# Patient Record
Sex: Female | Born: 1955 | Race: Black or African American | Hispanic: No | Smoking: Never smoker
Health system: Southern US, Community
[De-identification: ages and names within clinical notes are randomized; demographics above are authoritative.]

## PROBLEM LIST (undated history)

## (undated) DIAGNOSIS — K219 Gastro-esophageal reflux disease without esophagitis: Secondary | ICD-10-CM

## (undated) DIAGNOSIS — E785 Hyperlipidemia, unspecified: Secondary | ICD-10-CM

## (undated) DIAGNOSIS — I10 Essential (primary) hypertension: Secondary | ICD-10-CM

## (undated) HISTORY — DX: Gastro-esophageal reflux disease without esophagitis: K21.9

## (undated) HISTORY — DX: Hyperlipidemia, unspecified: E78.5

## (undated) HISTORY — DX: Essential (primary) hypertension: I10

---
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
## 1991-07-14 HISTORY — PX: ABDOMINAL HYSTERECTOMY: SUR658

## 2016-08-03 DIAGNOSIS — E785 Hyperlipidemia, unspecified: Secondary | ICD-10-CM | POA: Insufficient documentation

## 2016-08-03 DIAGNOSIS — R7303 Prediabetes: Secondary | ICD-10-CM | POA: Insufficient documentation

## 2016-11-17 DIAGNOSIS — Z90710 Acquired absence of both cervix and uterus: Secondary | ICD-10-CM | POA: Insufficient documentation

## 2018-12-06 LAB — BASIC METABOLIC PANEL
Chloride: 23 — AB (ref 99–108)
Creatinine: 0.8 (ref 0.5–1.1)
Glucose: 87
Potassium: 4.5 (ref 3.4–5.3)
Sodium: 143 (ref 137–147)

## 2018-12-06 LAB — COMPREHENSIVE METABOLIC PANEL
Albumin: 16 — AB (ref 3.5–5.0)
Calcium: 10.2 (ref 8.7–10.7)
GFR calc Af Amer: 85.5

## 2018-12-06 LAB — VITAMIN D 25 HYDROXY (VIT D DEFICIENCY, FRACTURES): Vit D, 25-Hydroxy: 27.1

## 2019-10-19 ENCOUNTER — Ambulatory Visit: Payer: Self-pay | Attending: Internal Medicine

## 2019-10-19 DIAGNOSIS — Z23 Encounter for immunization: Secondary | ICD-10-CM

## 2019-10-19 NOTE — Progress Notes (Signed)
   Covid-19 Vaccination Clinic  Name:  Natasha Morales    MRN: [id number] DOB: 10-24-1955  10/19/2019  Natasha Morales was observed post Covid-19 immunization for 15 minutes without incident. She was provided with Vaccine Information Sheet and instruction to access the V-Safe system.   Natasha Morales was instructed to call 911 with any severe reactions post vaccine: Marland Kitchen Difficulty breathing  . Swelling of face and throat  . A fast heartbeat  . A bad rash all over body  . Dizziness and weakness   Immunizations Administered    Name Date Dose VIS Date Route   Pfizer COVID-19 Vaccine 10/19/2019  9:16 AM 0.3 mL 06/23/2019 Intramuscular   Manufacturer: [employer], [employer]   Lot: [id number]   NDC: [address]-4

## 2019-11-14 ENCOUNTER — Ambulatory Visit: Payer: Self-pay | Attending: Internal Medicine

## 2020-03-13 LAB — TSH: TSH: 1.19 (ref 0.41–5.90)

## 2020-03-13 LAB — VITAMIN D 25 HYDROXY (VIT D DEFICIENCY, FRACTURES): Vit D, 25-Hydroxy: 16.5

## 2020-03-13 LAB — BASIC METABOLIC PANEL
CO2: 25 — AB (ref 13–22)
Chloride: 103 (ref 99–108)
Creatinine: 0.8 (ref 0.5–1.1)
Glucose: 66
Potassium: 4.2 (ref 3.4–5.3)
Sodium: 140 (ref 137–147)

## 2020-03-13 LAB — COMPREHENSIVE METABOLIC PANEL
Albumin: 5.1 — AB (ref 3.5–5.0)
Calcium: 11 — AB (ref 8.7–10.7)
GFR calc Af Amer: 88.8

## 2020-10-04 ENCOUNTER — Encounter: Payer: Self-pay | Admitting: Internal Medicine

## 2020-10-04 ENCOUNTER — Other Ambulatory Visit: Payer: Self-pay

## 2020-10-04 ENCOUNTER — Ambulatory Visit (INDEPENDENT_AMBULATORY_CARE_PROVIDER_SITE_OTHER): Payer: Medicare Other | Admitting: Internal Medicine

## 2020-10-04 VITALS — BP 176/100 | HR 105 | Temp 99.0°F | Ht 62.0 in | Wt 160.0 lb

## 2020-10-04 DIAGNOSIS — I10 Essential (primary) hypertension: Secondary | ICD-10-CM

## 2020-10-04 DIAGNOSIS — K219 Gastro-esophageal reflux disease without esophagitis: Secondary | ICD-10-CM | POA: Diagnosis not present

## 2020-10-04 MED ORDER — AMLODIPINE BESYLATE 10 MG PO TABS: 10.0000 mg | ORAL_TABLET | Freq: Every day | ORAL | 1 refills | Status: DC

## 2020-10-04 NOTE — Progress Notes (Signed)
Date:  10/04/2020   Name:  Natasha Morales   DOB:  04/23/1956   MRN:  [id number]   Chief Complaint: Establish Care and Hypertension  Hypertension This is a chronic problem. Episode onset: 20 years ago. Progression since onset: she admits to not taking meds regularly, being more stressed with her move and not eating as well - more take out and salty foods at home. The problem is controlled (at home recently higher ). Pertinent negatives include no chest pain, headaches, palpitations or shortness of breath. There are no associated agents to hypertension. Past treatments include calcium channel blockers.  Gastroesophageal Reflux She complains of heartburn. She reports no abdominal pain, no chest pain, no coughing or no wheezing. This is a recurrent problem. The problem occurs rarely. Pertinent negatives include no fatigue. She has tried a histamine-2 antagonist for the symptoms. The treatment provided significant relief.     Review of Systems  Constitutional: Negative for chills, fatigue and unexpected weight change.  HENT: Negative for nosebleeds.   Eyes: Negative for visual disturbance.  Respiratory: Negative for cough, chest tightness, shortness of breath and wheezing.   Cardiovascular: Negative for chest pain, palpitations and leg swelling.  Gastrointestinal: Positive for heartburn. Negative for abdominal pain, constipation and diarrhea.  Allergic/Immunologic: Negative for environmental allergies.  Neurological: Negative for dizziness, weakness, light-headedness and headaches.  Psychiatric/Behavioral: Negative for dysphoric mood. The patient is not nervous/anxious.     There are no problems to display for this patient.   No Known Allergies  Past Surgical History:  Procedure Laterality Date  . BREAST BIOPSY  1975    Social History   Tobacco Use  . Smoking status: Never Smoker  . Smokeless tobacco: Never Used  Vaping Use  . Vaping Use: Never used  Substance Use Topics  .  Alcohol use: Yes    Comment: rare  . Drug use: Never     Medication list has been reviewed and updated.  Current Meds  Medication Sig  . acetaminophen (TYLENOL) 500 MG tablet Take 500 mg by mouth every 6 (six) hours as needed.  . Cholecalciferol (D3-1000) 25 MCG (1000 UT) capsule Take 1 tablet by mouth 2 (two) times daily.  . famotidine (PEPCID) 20 MG tablet Take 20 mg by mouth 2 (two) times daily. PRN  . [DISCONTINUED] amLODipine (NORVASC) 10 MG tablet Take 1 tablet by mouth at bedtime.    PHQ 2/9 Scores 10/04/2020  PHQ - 2 Score 0  PHQ- 9 Score 0    GAD 7 : Generalized Anxiety Score 10/04/2020  Nervous, Anxious, on Edge 0  Control/stop worrying 0  Worry too much - different things 0  Trouble relaxing 0  Restless 0  Easily annoyed or irritable 0  Afraid - awful might happen 0  Total GAD 7 Score 0  Anxiety Difficulty Not difficult at all    BP Readings from Last 3 Encounters:  10/04/20 (!) 176/100    Physical Exam Vitals and nursing note reviewed.  Constitutional:      General: She is not in acute distress.    Appearance: Normal appearance. She is well-developed.  HENT:     Head: Normocephalic and atraumatic.  Neck:     Vascular: No carotid bruit.  Cardiovascular:     Rate and Rhythm: Normal rate and regular rhythm.     Pulses: Normal pulses.     Heart sounds: No murmur heard.   Pulmonary:     Effort: Pulmonary effort is normal. No respiratory distress.  Breath sounds: No wheezing or rhonchi.  Musculoskeletal:     Cervical back: Normal range of motion.     Right lower leg: No edema.     Left lower leg: No edema.  Lymphadenopathy:     Cervical: No cervical adenopathy.  Skin:    General: Skin is warm and dry.     Findings: No rash.  Neurological:     General: No focal deficit present.     Mental Status: She is alert and oriented to person, place, and time.  Psychiatric:        Mood and Affect: Mood normal.        Behavior: Behavior normal.      Wt Readings from Last 3 Encounters:  10/04/20 160 lb (72.6 kg)    BP (!) 176/100   Pulse (!) 105   Temp 99 F (37.2 C) (Oral)   Ht 5\' 2"  (1.575 m)   Wt 160 lb (72.6 kg)   SpO2 97%   BMI 29.26 kg/m   Assessment and Plan: 1. Essential hypertension Not controlled at present due to inadvertent medication non compliance. Work on improving diet - lower sodium and more water intake Follow up in one month - amLODipine (NORVASC) 10 MG tablet; Take 1 tablet (10 mg total) by mouth at bedtime.  Dispense: 30 tablet; Refill: 1  2. Gastroesophageal reflux disease, unspecified whether esophagitis present Symptoms well controlled on daily Pepcid. No red flag signs such as weight loss, n/v, melena Will continue.  HM - had mammogram in October; Colonoscopy 5 yrs ago at November. She will try to bring copies of reports to her next appointment. Partially dictated using [employer]. Any errors are unintentional.  Animal nutritionist, MD [facility]  10/04/2020

## 2020-11-04 ENCOUNTER — Ambulatory Visit: Payer: TRICARE For Life (TFL) | Admitting: Internal Medicine

## 2020-11-19 ENCOUNTER — Telehealth: Payer: Self-pay | Admitting: Internal Medicine

## 2020-11-19 NOTE — Telephone Encounter (Signed)
Pt reports that her bottle has a refill, the pharmacist just says he is unable to find her information in their system, please advise she is out  Medication Refill - Medication: amLODipine (NORVASC) 10 MG tablet   Pt is completely out of her current supply  Has the patient contacted their pharmacy? Yes.   (Agent: If no, request that the patient contact the pharmacy for the refill.) (Agent: If yes, when and what did the pharmacy advise?)  Preferred Pharmacy (with phone number or street name):  [facility] [phone] Dan Humphre[place], [place] - [address]  [address] [place] [address]  Phone: [phone] Fax: [phone]     Agent: Please be advised that RX refills may take up to 3 business days. We ask that you follow-up with your pharmacy.

## 2020-11-19 NOTE — Telephone Encounter (Signed)
Call to pharmacy- they filled Rx today and it is waiting for pick up. Call to patient- notified ready for pickup

## 2020-11-25 ENCOUNTER — Ambulatory Visit (INDEPENDENT_AMBULATORY_CARE_PROVIDER_SITE_OTHER): Payer: Medicare Other | Admitting: Internal Medicine

## 2020-11-25 ENCOUNTER — Encounter: Payer: Self-pay | Admitting: Internal Medicine

## 2020-11-25 ENCOUNTER — Ambulatory Visit: Payer: TRICARE For Life (TFL) | Admitting: Internal Medicine

## 2020-11-25 ENCOUNTER — Other Ambulatory Visit: Payer: Self-pay

## 2020-11-25 VITALS — BP 164/98 | HR 99 | Ht 62.0 in | Wt 164.0 lb

## 2020-11-25 DIAGNOSIS — Z1231 Encounter for screening mammogram for malignant neoplasm of breast: Secondary | ICD-10-CM | POA: Diagnosis not present

## 2020-11-25 DIAGNOSIS — I10 Essential (primary) hypertension: Secondary | ICD-10-CM | POA: Diagnosis not present

## 2020-11-25 DIAGNOSIS — Z13820 Encounter for screening for osteoporosis: Secondary | ICD-10-CM | POA: Diagnosis not present

## 2020-11-25 DIAGNOSIS — G43909 Migraine, unspecified, not intractable, without status migrainosus: Secondary | ICD-10-CM | POA: Insufficient documentation

## 2020-11-25 MED ORDER — AMLODIPINE BESYLATE 10 MG PO TABS: 10.0000 mg | ORAL_TABLET | Freq: Every day | ORAL | 1 refills | Status: DC

## 2020-11-25 MED ORDER — IRBESARTAN 150 MG PO TABS: 150.0000 mg | ORAL_TABLET | Freq: Every day | ORAL | 1 refills | Status: DC

## 2020-11-25 NOTE — Progress Notes (Signed)
Date:  11/25/2020   Name:  Natasha Morales   DOB:  1956-01-31   MRN:  [id number]   Chief Complaint: Hypertension (1 month follow up) [facility]  She has some records at home but did not bring them for review.  She believes that she is due for mammogram; unsure about colonoscopy.  Hypertension This is a chronic problem. The current episode started more than 1 month ago. The problem has been gradually improving since onset. Pertinent negatives include no chest pain, headaches, palpitations or shortness of breath. Past treatments include calcium channel blockers (resumed in March).    No results found for: CREATININE, BUN, NA, K, CL, CO2 No results found for: CHOL, HDL, LDLCALC, LDLDIRECT, TRIG, CHOLHDL No results found for: TSH No results found for: HGBA1C No results found for: WBC, HGB, HCT, MCV, PLT No results found for: ALT, AST, GGT, ALKPHOS, BILITOT   Review of Systems  Constitutional: Negative for fatigue and unexpected weight change.  HENT: Negative for nosebleeds.   Eyes: Negative for visual disturbance.  Respiratory: Negative for cough, chest tightness, shortness of breath and wheezing.   Cardiovascular: Negative for chest pain, palpitations and leg swelling.  Gastrointestinal: Negative for abdominal pain, constipation and diarrhea.  Neurological: Negative for dizziness, weakness, light-headedness and headaches.    Patient Active Problem List   Diagnosis Date Noted  . Essential hypertension 11/25/2020  . Migraine headache 11/25/2020  . S/P TAH (total abdominal hysterectomy) 11/17/2016  . Hyperlipidemia, unspecified 08/03/2016  . Prediabetes 08/03/2016    No Known Allergies  Past Surgical History:  Procedure Laterality Date  . BREAST BIOPSY  1975    Social History   Tobacco Use  . Smoking status: Never Smoker  . Smokeless tobacco: Never Used  Vaping Use  . Vaping Use: Never used  Substance Use Topics  .  Alcohol use: Yes    Comment: rare  . Drug use: Never     Medication list has been reviewed and updated.  Current Meds  Medication Sig  . acetaminophen (TYLENOL) 500 MG tablet Take 500 mg by mouth every 6 (six) hours as needed.  Marland Kitchen amLODipine (NORVASC) 10 MG tablet Take 1 tablet (10 mg total) by mouth at bedtime.  . Cholecalciferol (D3-1000) 25 MCG (1000 UT) capsule Take 1 tablet by mouth 2 (two) times daily.  . famotidine (PEPCID) 20 MG tablet Take 20 mg by mouth 2 (two) times daily. PRN    PHQ 2/9 Scores 11/25/2020 10/04/2020  PHQ - 2 Score 0 0  PHQ- 9 Score 0 0    GAD 7 : Generalized Anxiety Score 11/25/2020 10/04/2020  Nervous, Anxious, on Edge 0 0  Control/stop worrying 0 0  Worry too much - different things 0 0  Trouble relaxing 0 0  Restless 0 0  Easily annoyed or irritable 0 0  Afraid - awful might happen 0 0  Total GAD 7 Score 0 0  Anxiety Difficulty Not difficult at all Not difficult at all    BP Readings from Last 3 Encounters:  11/25/20 (!) 164/98  10/04/20 (!) 176/100    Physical Exam Vitals and nursing note reviewed.  Constitutional:      General: She is not in acute distress.    Appearance: She is well-developed.  HENT:     Head: Normocephalic and atraumatic.  Cardiovascular:     Rate and Rhythm: Normal rate and regular rhythm.     Pulses: Normal pulses.  Heart sounds: No murmur heard.   Pulmonary:     Effort: Pulmonary effort is normal. No respiratory distress.     Breath sounds: No wheezing or rhonchi.  Musculoskeletal:     Cervical back: Normal range of motion.  Lymphadenopathy:     Cervical: No cervical adenopathy.  Skin:    General: Skin is warm and dry.     Capillary Refill: Capillary refill takes less than 2 seconds.     Findings: No rash.  Neurological:     General: No focal deficit present.     Mental Status: She is alert and oriented to person, place, and time.  Psychiatric:        Mood and Affect: Mood normal.        Behavior:  Behavior normal.     Wt Readings from Last 3 Encounters:  11/25/20 164 lb (74.4 kg)  10/04/20 160 lb (72.6 kg)    BP (!) 164/98   Pulse 99   Ht 5\' 2"  (1.575 m)   Wt 164 lb (74.4 kg)   SpO2 98%   BMI 30.00 kg/m   Assessment and Plan: 1. Essential hypertension BP is not controlled on amlodipine alone. Patient admits to some dietary indiscretion but this is unlikely to have this large an impact on BP. Will add Avapro 150 mg per day. - irbesartan (AVAPRO) 150 MG tablet; Take 1 tablet (150 mg total) by mouth daily.  Dispense: 90 tablet; Refill: 1 - CBC with Differential/Platelet - Comprehensive metabolic panel - TSH - amLODipine (NORVASC) 10 MG tablet; Take 1 tablet (10 mg total) by mouth at bedtime.  Dispense: 90 tablet; Refill: 1  2. 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 Will try to have patient sign for previous records for comparison and possible diagnosis.   Partially dictated using [facility]. Any errors are unintentional.  Animal nutritionist, MD [facility]  11/25/2020

## 2020-11-26 LAB — COMPREHENSIVE METABOLIC PANEL
ALT: 20 IU/L (ref 0–32)
AST: 15 IU/L (ref 0–40)
Albumin/Globulin Ratio: 1.7 (ref 1.2–2.2)
Albumin: 4.7 g/dL (ref 3.8–4.8)
Alkaline Phosphatase: 96 IU/L (ref 44–121)
BUN/Creatinine Ratio: 17 (ref 12–28)
BUN: 15 mg/dL (ref 8–27)
Bilirubin Total: <0.2 mg/dL (ref 0.0–1.2)
CO2: 21 mmol/L (ref 20–29)
Calcium: 10.5 mg/dL — ABNORMAL HIGH (ref 8.7–10.3)
Chloride: 101 mmol/L (ref 96–106)
Creatinine, Ser: 0.89 mg/dL (ref 0.57–1.00)
Globulin, Total: 2.7 g/dL (ref 1.5–4.5)
Glucose: 88 mg/dL (ref 65–99)
Potassium: 4.3 mmol/L (ref 3.5–5.2)
Sodium: 137 mmol/L (ref 134–144)
Total Protein: 7.4 g/dL (ref 6.0–8.5)
eGFR: 72 mL/min/{1.73_m2} (ref >59)

## 2020-11-26 LAB — CBC WITH DIFFERENTIAL/PLATELET
Basophils Absolute: 0 10*3/uL (ref 0.0–0.2)
Basos: 1 %
EOS (ABSOLUTE): 0.1 10*3/uL (ref 0.0–0.4)
Eos: 1 %
Hematocrit: 44.8 % (ref 34.0–46.6)
Hemoglobin: 14.3 g/dL (ref 11.1–15.9)
Immature Grans (Abs): 0 10*3/uL (ref 0.0–0.1)
Immature Granulocytes: 0 %
Lymphocytes Absolute: 2.2 10*3/uL (ref 0.7–3.1)
Lymphs: 41 %
MCH: 27.8 pg (ref 26.6–33.0)
MCHC: 31.9 g/dL (ref 31.5–35.7)
MCV: 87 fL (ref 79–97)
Monocytes Absolute: 0.5 10*3/uL (ref 0.1–0.9)
Monocytes: 10 %
Neutrophils Absolute: 2.5 10*3/uL (ref 1.4–7.0)
Neutrophils: 47 %
Platelets: 359 10*3/uL (ref 150–450)
RBC: 5.15 x10E6/uL (ref 3.77–5.28)
RDW: 13.4 % (ref 11.7–15.4)
WBC: 5.3 10*3/uL (ref 3.4–10.8)

## 2020-11-26 LAB — TSH: TSH: 1.21 u[IU]/mL (ref 0.450–4.500)

## 2020-11-28 ENCOUNTER — Ambulatory Visit
Admission: RE | Admit: 2020-11-28 | Discharge: 2020-11-28 | Disposition: A | Discharge status: Home / Self Care | Payer: Medicare Other | Source: Ambulatory Visit | Attending: Internal Medicine | Admitting: Internal Medicine

## 2020-11-28 ENCOUNTER — Other Ambulatory Visit: Payer: Self-pay

## 2020-11-28 DIAGNOSIS — Z1231 Encounter for screening mammogram for malignant neoplasm of breast: Secondary | ICD-10-CM | POA: Insufficient documentation

## 2021-01-28 ENCOUNTER — Encounter: Payer: Self-pay | Admitting: Internal Medicine

## 2021-01-28 ENCOUNTER — Other Ambulatory Visit: Payer: Self-pay

## 2021-01-28 ENCOUNTER — Ambulatory Visit (INDEPENDENT_AMBULATORY_CARE_PROVIDER_SITE_OTHER): Payer: Medicare Other | Admitting: Internal Medicine

## 2021-01-28 VITALS — BP 130/88 | HR 87 | Temp 98.9°F | Ht 62.0 in | Wt 161.0 lb

## 2021-01-28 DIAGNOSIS — I10 Essential (primary) hypertension: Secondary | ICD-10-CM

## 2021-01-28 NOTE — Patient Instructions (Addendum)
Goal for blood pressure is   115 - 140 / less than 90  Pulse rate range 60-90  Look for Colonoscopy records  Bring blood pressure cuff to next visit

## 2021-01-28 NOTE — Progress Notes (Signed)
Date:  01/28/2021   Name:  Natasha Morales   DOB:  May 17, 1956   MRN:  [id number]   Chief Complaint: Hypertension  Hypertension This is a chronic problem. The problem has been gradually improving since onset. Pertinent negatives include no chest pain, headaches, palpitations or shortness of breath. There are no associated agents to hypertension. There are no known risk factors for coronary artery disease. Past treatments include angiotensin blockers and calcium channel blockers (avapro added last visit but she never started).  She has been working on drinking more water and limiting sodium.  She is not exercising regularly.  Home BP have been much better in the the 130 systolic range.  Lab Results  Component Value Date   CREATININE 0.89 11/25/2020   BUN 15 11/25/2020   NA 137 11/25/2020   K 4.3 11/25/2020   CL 101 11/25/2020   CO2 21 11/25/2020   No results found for: CHOL, HDL, LDLCALC, LDLDIRECT, TRIG, CHOLHDL Lab Results  Component Value Date   TSH 1.210 11/25/2020   No results found for: HGBA1C Lab Results  Component Value Date   WBC 5.3 11/25/2020   HGB 14.3 11/25/2020   HCT 44.8 11/25/2020   MCV 87 11/25/2020   PLT 359 11/25/2020   Lab Results  Component Value Date   ALT 20 11/25/2020   AST 15 11/25/2020   ALKPHOS 96 11/25/2020   BILITOT <0.2 11/25/2020     Review of Systems  Constitutional:  Negative for chills, fatigue and fever.  Respiratory:  Negative for chest tightness, shortness of breath and wheezing.   Cardiovascular:  Negative for chest pain and palpitations.  Neurological:  Negative for dizziness, tremors, weakness, light-headedness and headaches.  Psychiatric/Behavioral:  Negative for dysphoric mood and sleep disturbance. The patient is not nervous/anxious.    Patient Active Problem List   Diagnosis Date Noted   Essential hypertension 11/25/2020   Migraine headache 11/25/2020   S/P TAH (total abdominal hysterectomy) 11/17/2016   Hyperlipidemia,  unspecified 08/03/2016   Prediabetes 08/03/2016    No Known Allergies  Past Surgical History:  Procedure Laterality Date   BREAST BIOPSY  1975    Social History   Tobacco Use   Smoking status: Never   Smokeless tobacco: Never  Vaping Use   Vaping Use: Never used  Substance Use Topics   Alcohol use: Yes    Comment: rare   Drug use: Never     Medication list has been reviewed and updated.  Current Meds  Medication Sig   acetaminophen (TYLENOL) 500 MG tablet Take 500 mg by mouth every 6 (six) hours as needed.   amLODipine (NORVASC) 10 MG tablet Take 1 tablet (10 mg total) by mouth at bedtime. (Patient taking differently: Take 10 mg by mouth in the morning.)   [DISCONTINUED] Cholecalciferol (D3-1000) 25 MCG (1000 UT) capsule Take 1 tablet by mouth 2 (two) times daily.   [DISCONTINUED] famotidine (PEPCID) 20 MG tablet Take 20 mg by mouth 2 (two) times daily. PRN    PHQ 2/9 Scores 01/28/2021 11/25/2020 10/04/2020  PHQ - 2 Score 0 0 0  PHQ- 9 Score 0 0 0    GAD 7 : Generalized Anxiety Score 01/28/2021 11/25/2020 10/04/2020  Nervous, Anxious, on Edge 0 0 0  Control/stop worrying 0 0 0  Worry too much - different things 0 0 0  Trouble relaxing 1 0 0  Restless 0 0 0  Easily annoyed or irritable 0 0 0  Afraid - awful might happen  0 0 0  Total GAD 7 Score 1 0 0  Anxiety Difficulty - Not difficult at all Not difficult at all    BP Readings from Last 3 Encounters:  01/28/21 130/88  11/25/20 (!) 164/98  10/04/20 (!) 176/100    Physical Exam Vitals and nursing note reviewed.  Constitutional:      General: She is not in acute distress.    Appearance: She is well-developed.  HENT:     Head: Normocephalic and atraumatic.  Neck:     Vascular: No carotid bruit.  Cardiovascular:     Rate and Rhythm: Normal rate and regular rhythm.     Pulses: Normal pulses.     Heart sounds: No murmur heard. Pulmonary:     Effort: Pulmonary effort is normal. No respiratory distress.      Breath sounds: No wheezing or rhonchi.  Musculoskeletal:     Cervical back: Normal range of motion.     Right lower leg: No edema.     Left lower leg: No edema.  Lymphadenopathy:     Cervical: No cervical adenopathy.  Skin:    General: Skin is warm and dry.     Findings: No rash.  Neurological:     Mental Status: She is alert and oriented to person, place, and time.  Psychiatric:        Mood and Affect: Mood normal.        Behavior: Behavior normal.    Wt Readings from Last 3 Encounters:  01/28/21 161 lb (73 kg)  11/25/20 164 lb (74.4 kg)  10/04/20 160 lb (72.6 kg)    BP 130/88 Comment: at home before this visit  Pulse 87   Temp 98.9 F (37.2 C) (Oral)   Ht 5\' 2"  (1.575 m)   Wt 161 lb (73 kg)   SpO2 99%   BMI 29.45 kg/m   Assessment and Plan: 1. Essential hypertension BP improved with lifestyle changes Continue Amlodipine; consider starting Avapro Recent labs reviewed and are normal Follow up in 4 months for HTN and repeat labs Bring home cuff to next OV  Consider Prevnar-20 next visit   Partially dictated using Dragon software. Any errors are unintentional.  , MD [facility]  01/28/2021

## 2021-06-04 ENCOUNTER — Ambulatory Visit: Payer: Medicare Other | Admitting: Internal Medicine

## 2021-06-09 ENCOUNTER — Ambulatory Visit: Payer: Medicare Other | Admitting: Internal Medicine

## 2021-06-17 ENCOUNTER — Other Ambulatory Visit: Payer: Self-pay

## 2021-06-17 ENCOUNTER — Ambulatory Visit (INDEPENDENT_AMBULATORY_CARE_PROVIDER_SITE_OTHER): Payer: Medicare Other | Admitting: Internal Medicine

## 2021-06-17 ENCOUNTER — Encounter: Payer: Self-pay | Admitting: Internal Medicine

## 2021-06-17 VITALS — BP 150/82 | HR 82 | Ht 62.0 in | Wt 167.2 lb

## 2021-06-17 DIAGNOSIS — Z23 Encounter for immunization: Secondary | ICD-10-CM

## 2021-06-17 DIAGNOSIS — I10 Essential (primary) hypertension: Secondary | ICD-10-CM

## 2021-06-17 DIAGNOSIS — H40009 Preglaucoma, unspecified, unspecified eye: Secondary | ICD-10-CM | POA: Insufficient documentation

## 2021-06-17 DIAGNOSIS — Z1159 Encounter for screening for other viral diseases: Secondary | ICD-10-CM

## 2021-06-17 MED ORDER — AMLODIPINE BESYLATE 10 MG PO TABS: 10.0000 mg | ORAL_TABLET | Freq: Every morning | ORAL | 1 refills | Status: DC

## 2021-06-17 NOTE — Progress Notes (Signed)
Date:  06/17/2021   Name:  Natasha Morales   DOB:  [birth year]   MRN:  [id number]   Chief Complaint: Hypertension  Hypertension This is a chronic problem. The problem is unchanged. The problem is uncontrolled (at home mostly over 759 systolic). Pertinent negatives include no chest pain, headaches, palpitations or shortness of breath. Past treatments include calcium channel blockers and angiotensin blockers (avapro never started). The current treatment provides moderate improvement. There is no history of kidney disease, CAD/MI or CVA.   Lab Results  Component Value Date   NA 137 11/25/2020   K 4.3 11/25/2020   CO2 21 11/25/2020   GLUCOSE 88 11/25/2020   BUN 15 11/25/2020   CREATININE 0.89 11/25/2020   CALCIUM 10.5 (H) 11/25/2020   EGFR 72 11/25/2020   No results found for: CHOL, HDL, LDLCALC, LDLDIRECT, TRIG, CHOLHDL Lab Results  Component Value Date   TSH 1.210 11/25/2020   No results found for: HGBA1C Lab Results  Component Value Date   WBC 5.3 11/25/2020   HGB 14.3 11/25/2020   HCT 44.8 11/25/2020   MCV 87 11/25/2020   PLT 359 11/25/2020   Lab Results  Component Value Date   ALT 20 11/25/2020   AST 15 11/25/2020   ALKPHOS 96 11/25/2020   BILITOT <0.2 11/25/2020   Lab Results  Component Value Date   VD25OH 16.5 03/13/2020     Review of Systems  Constitutional:  Negative for fatigue and unexpected weight change.  HENT:  Negative for nosebleeds.   Eyes:  Negative for visual disturbance.  Respiratory:  Negative for cough, chest tightness, shortness of breath and wheezing.   Cardiovascular:  Negative for chest pain, palpitations and leg swelling.  Gastrointestinal:  Negative for abdominal pain, constipation and diarrhea.  Neurological:  Negative for dizziness, weakness, light-headedness and headaches.   Patient Active Problem List   Diagnosis Date Noted   Borderline glaucoma 06/17/2021   Essential hypertension 11/25/2020   Migraine headache 11/25/2020   S/P  TAH (total abdominal hysterectomy) 11/17/2016   Hyperlipidemia, unspecified 08/03/2016   Prediabetes 08/03/2016    No Known Allergies  Past Surgical History:  Procedure Laterality Date   BREAST BIOPSY  1975    Social History   Tobacco Use   Smoking status: Never   Smokeless tobacco: Never  Vaping Use   Vaping Use: Never used  Substance Use Topics   Alcohol use: Yes    Comment: rare   Drug use: Never     Medication list has been reviewed and updated.  Current Meds  Medication Sig   acetaminophen (TYLENOL) 500 MG tablet Take 500 mg by mouth every 6 (six) hours as needed.   amLODipine (NORVASC) 10 MG tablet Take 1 tablet (10 mg total) by mouth at bedtime. (Patient taking differently: Take 10 mg by mouth in the morning.)    PHQ 2/9 Scores 06/17/2021 01/28/2021 11/25/2020 10/04/2020  PHQ - 2 Score 1 0 0 0  PHQ- 9 Score 1 0 0 0    GAD 7 : Generalized Anxiety Score 06/17/2021 01/28/2021 11/25/2020 10/04/2020  Nervous, Anxious, on Edge 0 0 0 0  Control/stop worrying 0 0 0 0  Worry too much - different things 0 0 0 0  Trouble relaxing 0 1 0 0  Restless 0 0 0 0  Easily annoyed or irritable 0 0 0 0  Afraid - awful might happen 0 0 0 0  Total GAD 7 Score 0 1 0 0  Anxiety Difficulty Not  difficult at all - Not difficult at all Not difficult at all    BP Readings from Last 3 Encounters:  06/17/21 (!) 150/82  01/28/21 130/88  11/25/20 (!) 164/98    Physical Exam Vitals and nursing note reviewed.  Constitutional:      General: She is not in acute distress.    Appearance: She is well-developed.  HENT:     Head: Normocephalic and atraumatic.  Cardiovascular:     Rate and Rhythm: Normal rate and regular rhythm.     Pulses: Normal pulses.  Pulmonary:     Effort: Pulmonary effort is normal. No respiratory distress.     Breath sounds: No rales.  Musculoskeletal:     Cervical back: Normal range of motion.     Right lower leg: No edema.     Left lower leg: No edema.  Skin:     General: Skin is warm and dry.     Findings: No rash.  Neurological:     General: No focal deficit present.     Mental Status: She is alert and oriented to person, place, and time.  Psychiatric:        Mood and Affect: Mood normal.        Behavior: Behavior normal.    Wt Readings from Last 3 Encounters:  06/17/21 167 lb 3.2 oz (75.8 kg)  01/28/21 161 lb (73 kg)  11/25/20 164 lb (74.4 kg)    BP (!) 150/82   Pulse 82   Ht 5' 2" (1.575 m)   Wt 167 lb 3.2 oz (75.8 kg)   SpO2 97%   BMI 30.58 kg/m   Assessment and Plan: 1. Essential hypertension She has been hesitant to add Avapro but BPs are running well over 130 even at home Start Avapro 75 mg daily for 1-2 weeks then 150 mg daily Follow up in 2 months - amLODipine (NORVASC) 10 MG tablet; Take 1 tablet (10 mg total) by mouth in the morning.  Dispense: 90 tablet; Refill: 1  2. Hypercalcemia Will check labs again with PTH - Ca+Creat+P+PTH Intact  3. Need for hepatitis C screening test - Hepatitis C antibody  4. Need for vaccination for pneumococcus Pt declines today  5. Need for immunization against influenza - Flu Vaccine QUAD High Dose(Fluad)   Partially dictated using [employer]. Any errors are unintentional.  Halina Maidens, MD [facility]  06/17/2021

## 2021-06-18 LAB — CA+CREAT+P+PTH INTACT
Calcium: 10.8 mg/dL — ABNORMAL HIGH (ref 8.7–10.3)
Creatinine, Ser: 0.92 mg/dL (ref 0.57–1.00)
PTH: 51 pg/mL (ref 15–65)
Phosphorus: 3.4 mg/dL (ref 3.0–4.3)
eGFR: 69 mL/min/{1.73_m2} (ref >59)

## 2021-06-18 LAB — HEPATITIS C ANTIBODY: Hep C Virus Ab: <0.1 s/co ratio (ref 0.0–0.9)

## 2021-06-20 ENCOUNTER — Other Ambulatory Visit: Payer: Self-pay

## 2021-08-18 ENCOUNTER — Ambulatory Visit: Payer: Medicare Other | Admitting: Internal Medicine

## 2021-08-22 ENCOUNTER — Encounter: Payer: Self-pay | Admitting: Internal Medicine

## 2021-08-22 ENCOUNTER — Ambulatory Visit (INDEPENDENT_AMBULATORY_CARE_PROVIDER_SITE_OTHER): Payer: Medicare Other | Admitting: Internal Medicine

## 2021-08-22 ENCOUNTER — Other Ambulatory Visit: Payer: Self-pay

## 2021-08-22 VITALS — BP 190/108 | HR 90 | Ht 62.0 in | Wt 167.0 lb

## 2021-08-22 DIAGNOSIS — N3001 Acute cystitis with hematuria: Secondary | ICD-10-CM

## 2021-08-22 DIAGNOSIS — I10 Essential (primary) hypertension: Secondary | ICD-10-CM | POA: Diagnosis not present

## 2021-08-22 LAB — POCT URINALYSIS DIPSTICK
Bilirubin, UA: NEGATIVE
Blood, UA: NEGATIVE
Glucose, UA: NEGATIVE
Ketones, UA: NEGATIVE
Nitrite, UA: NEGATIVE
Protein, UA: NEGATIVE
Spec Grav, UA: <=1.005 — AB (ref 1.010–1.025)
Urobilinogen, UA: 0.2 E.U./dL
pH, UA: 6 (ref 5.0–8.0)

## 2021-08-22 LAB — POCT WET PREP WITH KOH
Clue Cells Wet Prep HPF POC: NEGATIVE
KOH Prep POC: NEGATIVE
RBC Wet Prep HPF POC: 0
Trichomonas, UA: NEGATIVE
WBC Wet Prep HPF POC: 3

## 2021-08-22 MED ORDER — SULFAMETHOXAZOLE-TRIMETHOPRIM 800-160 MG PO TABS: 1.0000 | ORAL_TABLET | Freq: Two times a day (BID) | ORAL | 0 refills | Status: AC

## 2021-08-22 NOTE — Progress Notes (Signed)
Date:  08/22/2021   Name:  Natasha Morales   DOB:  09-21-1955   MRN:  [id number]   Chief Complaint: Hypertension (Last 2-3 weeks have not taken BP meds regularly. Due to bloody vaginal discharge. Sees dark blood on pads ) and Vaginal Discharge (X +3 weeks, Spotting in pads and when whipping )  Hypertension This is a chronic problem. The problem is uncontrolled (due to stopping medication over concern for bleeding side effect). Pertinent negatives include no chest pain, headaches or shortness of breath.  Hematuria This is a new problem. The problem has been waxing and waning since onset. She describes the hematuria as gross hematuria. The hematuria occurs during the terminal portion of her urinary stream. She reports no clotting in her urine stream. The pain is mild. She describes her urine color as clear. Irritative symptoms do not include frequency or urgency. Obstructive symptoms do not include dribbling or incomplete emptying. Associated symptoms include abdominal pain (mild pelvic discomfort). Pertinent negatives include no chills or fever. Her past medical history is significant for hypertension.  She sees some light brown vaginal discharge on her panties but no red blood.  No itching or discomfort.  S/p partial hysterectomy. Lab Results  Component Value Date   NA 137 11/25/2020   K 4.3 11/25/2020   CO2 21 11/25/2020   GLUCOSE 88 11/25/2020   BUN 15 11/25/2020   CREATININE 0.92 06/17/2021   CALCIUM 10.8 (H) 06/17/2021   EGFR 69 06/17/2021   No results found for: CHOL, HDL, LDLCALC, LDLDIRECT, TRIG, CHOLHDL Lab Results  Component Value Date   TSH 1.210 11/25/2020   No results found for: HGBA1C Lab Results  Component Value Date   WBC 5.3 11/25/2020   HGB 14.3 11/25/2020   HCT 44.8 11/25/2020   MCV 87 11/25/2020   PLT 359 11/25/2020   Lab Results  Component Value Date   ALT 20 11/25/2020   AST 15 11/25/2020   ALKPHOS 96 11/25/2020   BILITOT <0.2 11/25/2020   Lab Results   Component Value Date   VD25OH 16.5 03/13/2020     Review of Systems  Constitutional:  Negative for chills, fatigue and fever.  Respiratory:  Negative for cough, chest tightness and shortness of breath.   Cardiovascular:  Negative for chest pain and leg swelling.  Gastrointestinal:  Positive for abdominal pain (mild pelvic discomfort).  Genitourinary:  Positive for hematuria. Negative for frequency, incomplete emptying and urgency.  Neurological:  Negative for dizziness and headaches.   Patient Active Problem List   Diagnosis Date Noted   Borderline glaucoma 06/17/2021   Essential hypertension 11/25/2020   Migraine headache 11/25/2020   S/P TAH (total abdominal hysterectomy) 11/17/2016   Hyperlipidemia, unspecified 08/03/2016   Prediabetes 08/03/2016    No Known Allergies  Past Surgical History:  Procedure Laterality Date   ABDOMINAL HYSTERECTOMY  1993   BREAST BIOPSY  1975    Social History   Tobacco Use   Smoking status: Never   Smokeless tobacco: Never  Vaping Use   Vaping Use: Never used  Substance Use Topics   Alcohol use: Yes    Comment: rare   Drug use: Never     Medication list has been reviewed and updated.  Current Meds  Medication Sig   acetaminophen (TYLENOL) 500 MG tablet Take 500 mg by mouth every 6 (six) hours as needed.   amLODipine (NORVASC) 10 MG tablet Take 1 tablet (10 mg total) by mouth in the morning.   irbesartan (  AVAPRO) 150 MG tablet Take 1 tablet (150 mg total) by mouth daily.   sulfamethoxazole-trimethoprim (BACTRIM DS) 800-160 MG tablet Take 1 tablet by mouth 2 (two) times daily for 7 days.    PHQ 2/9 Scores 08/22/2021 06/17/2021 01/28/2021 11/25/2020  PHQ - 2 Score 0 1 0 0  PHQ- 9 Score 2 1 0 0    GAD 7 : Generalized Anxiety Score 08/22/2021 06/17/2021 01/28/2021 11/25/2020  Nervous, Anxious, on Edge 1 0 0 0  Control/stop worrying 1 0 0 0  Worry too much - different things 1 0 0 0  Trouble relaxing 1 0 1 0  Restless 0 0 0 0   Easily annoyed or irritable 1 0 0 0  Afraid - awful might happen 0 0 0 0  Total GAD 7 Score 5 0 1 0  Anxiety Difficulty - Not difficult at all - Not difficult at all    BP Readings from Last 3 Encounters:  08/22/21 (!) 190/108  06/17/21 (!) 150/82  01/28/21 130/88    Physical Exam Vitals and nursing note reviewed.  Constitutional:      General: She is not in acute distress.    Appearance: Normal appearance. She is well-developed.  HENT:     Head: Normocephalic and atraumatic.  Cardiovascular:     Rate and Rhythm: Normal rate and regular rhythm.     Pulses: Normal pulses.  Pulmonary:     Effort: Pulmonary effort is normal. No respiratory distress.     Breath sounds: No wheezing or rhonchi.  Abdominal:     General: Abdomen is flat.     Palpations: Abdomen is soft.     Tenderness: There is abdominal tenderness in the suprapubic area. There is no right CVA tenderness, left CVA tenderness, guarding or rebound.  Musculoskeletal:     Cervical back: Normal range of motion.  Skin:    General: Skin is warm and dry.     Findings: No rash.  Neurological:     Mental Status: She is alert and oriented to person, place, and time.  Psychiatric:        Mood and Affect: Mood normal.        Behavior: Behavior normal.    Wt Readings from Last 3 Encounters:  08/22/21 167 lb (75.8 kg)  06/17/21 167 lb 3.2 oz (75.8 kg)  01/28/21 161 lb (73 kg)    BP (!) 190/108    Pulse 90    Ht 5' 2"  (1.575 m)    Wt 167 lb (75.8 kg)    SpO2 95%    BMI 30.54 kg/m   Assessment and Plan: 1. Essential hypertension Very elevated due to medication non compliance and anxiety over bleeding She will resume medications. Follow up in one month. Goal BP < 130/80  2. Acute cystitis with hematuria Likely the cause of terminal blood and pelvic discomfort. Will treat, obtain culture If still having pelvic discomfort at follow up will consider pelvic US - sulfamethoxazole-trimethoprim (BACTRIM DS) 800-160 MG  tablet; Take 1 tablet by mouth 2 (two) times daily for 7 days.  Dispense: 14 tablet; Refill: 0 - Urine Culture - POCT urinalysis dipstick   Partially dictated using Editor, commissioning. Any errors are unintentional.  Halina Maidens, MD [facility]  08/22/2021

## 2021-08-22 NOTE — Patient Instructions (Signed)
Goal blood pressure is less than 130/80.

## 2021-08-26 LAB — URINE CULTURE

## 2021-09-23 ENCOUNTER — Other Ambulatory Visit: Payer: Self-pay

## 2021-09-23 ENCOUNTER — Encounter: Payer: Self-pay | Admitting: Internal Medicine

## 2021-09-23 ENCOUNTER — Ambulatory Visit (INDEPENDENT_AMBULATORY_CARE_PROVIDER_SITE_OTHER): Payer: Medicare Other | Admitting: Internal Medicine

## 2021-09-23 DIAGNOSIS — I10 Essential (primary) hypertension: Secondary | ICD-10-CM | POA: Diagnosis not present

## 2021-09-23 MED ORDER — AMLODIPINE BESYLATE 10 MG PO TABS: 10.0000 mg | ORAL_TABLET | Freq: Every morning | ORAL | 1 refills | Status: AC

## 2021-09-23 NOTE — Progress Notes (Signed)
? ? ?Date:  09/23/2021  ? ?Name:  Natasha Morales   DOB:  [birth year]   MRN:  [id number] ? ? ?Chief Complaint: Hypertension ? ?Hypertension ?This is a chronic problem. The current episode started more than 1 year ago. The problem is unchanged. The problem is controlled. Pertinent negatives include no chest pain, headaches or shortness of breath.  ?At last visit Norvasc 10 mg was resumed.  She also continued taking Irbesartan 75 mg once a day.  She feels well.  She has not checked her BP at home as that worsens her anxiety. ? ?Lab Results  ?Component Value Date  ? NA 137 11/25/2020  ? K 4.3 11/25/2020  ? CO2 21 11/25/2020  ? GLUCOSE 88 11/25/2020  ? BUN 15 11/25/2020  ? CREATININE 0.92 06/17/2021  ? CALCIUM 10.8 (H) 06/17/2021  ? EGFR 69 06/17/2021  ? ?No results found for: CHOL, HDL, LDLCALC, LDLDIRECT, TRIG, CHOLHDL ?Lab Results  ?Component Value Date  ? TSH 1.210 11/25/2020  ? ?No results found for: HGBA1C ?Lab Results  ?Component Value Date  ? WBC 5.3 11/25/2020  ? HGB 14.3 11/25/2020  ? HCT 44.8 11/25/2020  ? MCV 87 11/25/2020  ? PLT 359 11/25/2020  ? ?Lab Results  ?Component Value Date  ? ALT 20 11/25/2020  ? AST 15 11/25/2020  ? ALKPHOS 96 11/25/2020  ? BILITOT <0.2 11/25/2020  ? ?Lab Results  ?Component Value Date  ? VD25OH 16.5 03/13/2020  ?  ? ?Review of Systems  ?Constitutional:  Negative for chills and fatigue.  ?Respiratory:  Negative for chest tightness, shortness of breath and wheezing.   ?Cardiovascular:  Negative for chest pain and leg swelling.  ?Gastrointestinal:  Negative for constipation.  ?Neurological:  Negative for dizziness, light-headedness and headaches.  ? ?Patient Active Problem List  ? Diagnosis Date Noted  ? Borderline glaucoma 06/17/2021  ? Essential hypertension 11/25/2020  ? Migraine headache 11/25/2020  ? S/P TAH (total abdominal hysterectomy) 11/17/2016  ? Hyperlipidemia, unspecified 08/03/2016  ? Prediabetes 08/03/2016  ? ? ?No Known Allergies ? ?Past Surgical History:  ?Procedure  Laterality Date  ? ABDOMINAL HYSTERECTOMY  1993  ? BREAST BIOPSY  1975  ? ? ?Social History  ? ?Tobacco Use  ? Smoking status: Never  ? Smokeless tobacco: Never  ?Vaping Use  ? Vaping Use: Never used  ?Substance Use Topics  ? Alcohol use: Yes  ?  Comment: rare  ? Drug use: Never  ? ? ? ?Medication list has been reviewed and updated. ? ?Current Meds  ?Medication Sig  ? acetaminophen (TYLENOL) 500 MG tablet Take 500 mg by mouth every 6 (six) hours as needed.  ? irbesartan (AVAPRO) 150 MG tablet Take 1 tablet (150 mg total) by mouth daily.  ? [DISCONTINUED] amLODipine (NORVASC) 10 MG tablet Take 1 tablet (10 mg total) by mouth in the morning.  ? ? ?PHQ 2/9 Scores 09/23/2021 08/22/2021 06/17/2021 01/28/2021  ?PHQ - 2 Score 0 0 1 0  ?PHQ- 9 Score 0 2 1 0  ? ? ?GAD 7 : Generalized Anxiety Score 09/23/2021 08/22/2021 06/17/2021 01/28/2021  ?Nervous, Anxious, on Edge 0 1 0 0  ?Control/stop worrying 0 1 0 0  ?Worry too much - different things 1 1 0 0  ?Trouble relaxing 0 1 0 1  ?Restless 0 0 0 0  ?Easily annoyed or irritable 0 1 0 0  ?Afraid - awful might happen 0 0 0 0  ?Total GAD 7 Score 1 5 0 1  ?Anxiety Difficulty  Not difficult at all - Not difficult at all -  ? ? ?BP Readings from Last 3 Encounters:  ?09/23/21 (!) 146/88  ?08/22/21 (!) 190/108  ?06/17/21 (!) 150/82  ? ? ?Physical Exam ?Vitals and nursing note reviewed.  ?Constitutional:   ?   General: She is not in acute distress. ?   Appearance: Normal appearance. She is well-developed.  ?HENT:  ?   Head: Normocephalic and atraumatic.  ?Cardiovascular:  ?   Rate and Rhythm: Normal rate and regular rhythm.  ?Pulmonary:  ?   Effort: Pulmonary effort is normal. No respiratory distress.  ?   Breath sounds: No wheezing or rhonchi.  ?Musculoskeletal:  ?   Cervical back: Normal range of motion.  ?   Right lower leg: No edema.  ?   Left lower leg: No edema.  ?Skin: ?   General: Skin is warm and dry.  ?   Findings: No rash.  ?Neurological:  ?   Mental Status: She is alert and oriented  to person, place, and time.  ?Psychiatric:     ?   Mood and Affect: Mood normal.     ?   Behavior: Behavior normal.  ? ? ?Wt Readings from Last 3 Encounters:  ?09/23/21 169 lb 3.2 oz (76.7 kg)  ?08/22/21 167 lb (75.8 kg)  ?06/17/21 167 lb 3.2 oz (75.8 kg)  ? ? ?BP (!) 146/88 (BP Location: Right Arm, Cuff Size: Large)   Pulse 87   Ht 5' 2"  (1.575 m)   Wt 169 lb 3.2 oz (76.7 kg)   SpO2 97%   BMI 30.95 kg/m?  ? ?Assessment and Plan: ?1. Essential hypertension ?Gradually improving with better medication compliance. ?Continue amlodipine 10 mg and increase Irbesartan to 150 mg. ?Recheck in 4 weeks ?- amLODipine (NORVASC) 10 MG tablet; Take 1 tablet (10 mg total) by mouth in the morning.  Dispense: 90 tablet; Refill: 1 ?- Comprehensive metabolic panel ?- CBC with Differential/Platelet ?- TSH ? ? ?Partially dictated using Editor, commissioning. Any errors are unintentional. ? ?Halina Maidens, MD ?[facility] ?[facility] ? ?09/23/2021 ? ? ? ? ? ?

## 2021-09-23 NOTE — Patient Instructions (Signed)
Take Amlodipine 10 mg once a day and Irbesartan 150 mg once a day. ?

## 2021-09-24 LAB — CBC WITH DIFFERENTIAL/PLATELET
Basophils Absolute: 0.1 10*3/uL (ref 0.0–0.2)
Basos: 1 %
EOS (ABSOLUTE): 0.1 10*3/uL (ref 0.0–0.4)
Eos: 1 %
Hematocrit: 44.5 % (ref 34.0–46.6)
Hemoglobin: 14.6 g/dL (ref 11.1–15.9)
Immature Grans (Abs): 0 10*3/uL (ref 0.0–0.1)
Immature Granulocytes: 0 %
Lymphocytes Absolute: 1.8 10*3/uL (ref 0.7–3.1)
Lymphs: 39 %
MCH: 27.9 pg (ref 26.6–33.0)
MCHC: 32.8 g/dL (ref 31.5–35.7)
MCV: 85 fL (ref 79–97)
Monocytes Absolute: 0.4 10*3/uL (ref 0.1–0.9)
Monocytes: 8 %
Neutrophils Absolute: 2.4 10*3/uL (ref 1.4–7.0)
Neutrophils: 51 %
Platelets: 340 10*3/uL (ref 150–450)
RBC: 5.23 x10E6/uL (ref 3.77–5.28)
RDW: 13 % (ref 11.7–15.4)
WBC: 4.7 10*3/uL (ref 3.4–10.8)

## 2021-09-24 LAB — COMPREHENSIVE METABOLIC PANEL
ALT: 23 IU/L (ref 0–32)
AST: 17 IU/L (ref 0–40)
Albumin/Globulin Ratio: 1.6 (ref 1.2–2.2)
Albumin: 4.7 g/dL (ref 3.8–4.8)
Alkaline Phosphatase: 91 IU/L (ref 44–121)
BUN/Creatinine Ratio: 15 (ref 12–28)
BUN: 15 mg/dL (ref 8–27)
Bilirubin Total: 0.3 mg/dL (ref 0.0–1.2)
CO2: 21 mmol/L (ref 20–29)
Calcium: 10.5 mg/dL — ABNORMAL HIGH (ref 8.7–10.3)
Chloride: 101 mmol/L (ref 96–106)
Creatinine, Ser: 0.98 mg/dL (ref 0.57–1.00)
Globulin, Total: 2.9 g/dL (ref 1.5–4.5)
Glucose: 87 mg/dL (ref 70–99)
Potassium: 4.5 mmol/L (ref 3.5–5.2)
Sodium: 137 mmol/L (ref 134–144)
Total Protein: 7.6 g/dL (ref 6.0–8.5)
eGFR: 64 mL/min/{1.73_m2} (ref >59)

## 2021-09-24 LAB — TSH: TSH: 1.53 u[IU]/mL (ref 0.450–4.500)

## 2021-10-21 ENCOUNTER — Ambulatory Visit: Payer: Medicare Other | Admitting: Internal Medicine

## 2021-10-27 ENCOUNTER — Telehealth: Payer: Self-pay | Admitting: Internal Medicine

## 2021-10-27 NOTE — Telephone Encounter (Signed)
Copied from CRM [id number]. Topic: Medicare AWV ?>> Oct 27, 2021  2:55 PM Claudette Laws R wrote: ?Reason for CRM:  ?Left message for patient to call back and schedule Medicare Annual Wellness Visit (AWV) in office.  ? ?If unable to come into the office for AWV,  please offer to do virtually or by telephone. ? ?No hx of AWV eligible for AWVI per [place] as of 09/10/2021 ? ?Please schedule at anytime with [facility] Health Advisor.     ? ?45 minute appointment  ? ?Any questions, please call me at [phone] ?

## 2021-12-12 ENCOUNTER — Telehealth: Payer: Self-pay | Admitting: Internal Medicine

## 2021-12-12 NOTE — Telephone Encounter (Signed)
Copied from CRM [id number]. Topic: Medicare AWV >> Dec 12, 2021 11:56 AM Claudette Laws R wrote: Reason for CRM:  Left message for patient to call back and schedule Medicare Annual Wellness Visit (AWV) in office.   If unable to come into the office for AWV,  please offer to do virtually or by telephone.  No hx of AWV eligible for AWVI per [place] as of 09/10/2021  Please schedule at anytime with [facility] Health Advisor.      45 minute appointment   Any questions, please call me at [phone]

## 2022-02-02 ENCOUNTER — Telehealth: Payer: Self-pay | Admitting: Internal Medicine

## 2022-02-02 ENCOUNTER — Ambulatory Visit: Payer: Medicare Other | Admitting: Internal Medicine

## 2022-02-02 NOTE — Telephone Encounter (Signed)
Copied from CRM [id number]. Topic: Medicare AWV >> Feb 02, 2022 11:15 AM Zannie Kehr wrote: Reason for CRM:  Left message for patient to call back and schedule Medicare Annual Wellness Visit (AWV) in office.   If unable to come into the office for AWV,  please offer to do virtually or by telephone.  No hx of AWV eligible for AWVI per [place] as of 09/10/2021  Please schedule at anytime with [facility] Health Advisor.      45 minute appointment   Any questions, please call me at [phone]

## 2022-03-03 ENCOUNTER — Telehealth: Payer: Self-pay | Admitting: Internal Medicine

## 2022-03-03 NOTE — Telephone Encounter (Signed)
Copied from CRM [id number]. Topic: Medicare AWV >> Mar 03, 2022  2:29 PM Zannie Kehr wrote: Reason for CRM:  Left message for patient to call back and schedule Medicare Annual Wellness Visit (AWV) in office.   If unable to come into the office for AWV,  please offer to do virtually or by telephone.  No hx of AWV eligible for AWVI per [place] as of 09/10/2021  Please schedule at anytime with [facility] Health Advisor.      45 minute appointment   Any questions, please call me at [phone]

## 2022-04-29 ENCOUNTER — Telehealth: Payer: Self-pay | Admitting: Internal Medicine

## 2022-04-29 NOTE — Telephone Encounter (Signed)
Copied from [place] [id number]. Topic: Medicare AWV >> Apr 29, 2022 11:35 AM Jae Dire wrote: Reason for CRM:  Left message for patient to call back and schedule Medicare Annual Wellness Visit (AWV) in office.   If unable to come into the office for AWV,  please offer to do virtually or by telephone.  No hx of AWV eligible for AWVI per [place] as of 09/10/2021  Please schedule at any time with [facility] Health Advisor.      45 minute appointment   Any questions, please call me at [phone]

## 2022-08-11 ENCOUNTER — Telehealth: Payer: Self-pay | Admitting: Internal Medicine

## 2022-08-11 NOTE — Telephone Encounter (Signed)
Copied from [place] [id number]. Topic: Medicare AWV >> Aug 11, 2022  9:46 AM Devoria Glassing wrote: Reason for CRM: Left message tor patient to schedule Medicare Annual Wellness Visit (AWV) with [place], [place]  Appointment can be an offiice/telephone or virtual visit;  Please call [phone] ask for Juliann Pulse.

## 2023-02-02 ENCOUNTER — Telehealth: Payer: Self-pay | Admitting: Internal Medicine

## 2023-02-02 NOTE — Telephone Encounter (Signed)
Copied from CRM [id number]. Topic: Medicare AWV >> Feb 02, 2023 10:15 AM Payton Doughty wrote: Reason for CRM: LM 02/02/2023 to schedule AWV   Verlee Rossetti; Care Guide Ambulatory Clinical Support [place] l [facility] Direct Dial: [phone]

## 2023-02-06 IMAGING — MG MM DIGITAL SCREENING BILAT W/ TOMO AND CAD
8 series · 8 of 24 positions shown · non-contrast
Comparison: None.

CLINICAL DATA: Screening.

EXAM:
DIGITAL SCREENING BILATERAL MAMMOGRAM WITH TOMOSYNTHESIS AND CAD
TECHNIQUE: Bilateral screening digital craniocaudal and mediolateral oblique
mammograms were obtained. Bilateral screening digital breast
tomosynthesis was performed. The images were evaluated with
computer-aided detection.

[L CC synth-2D]
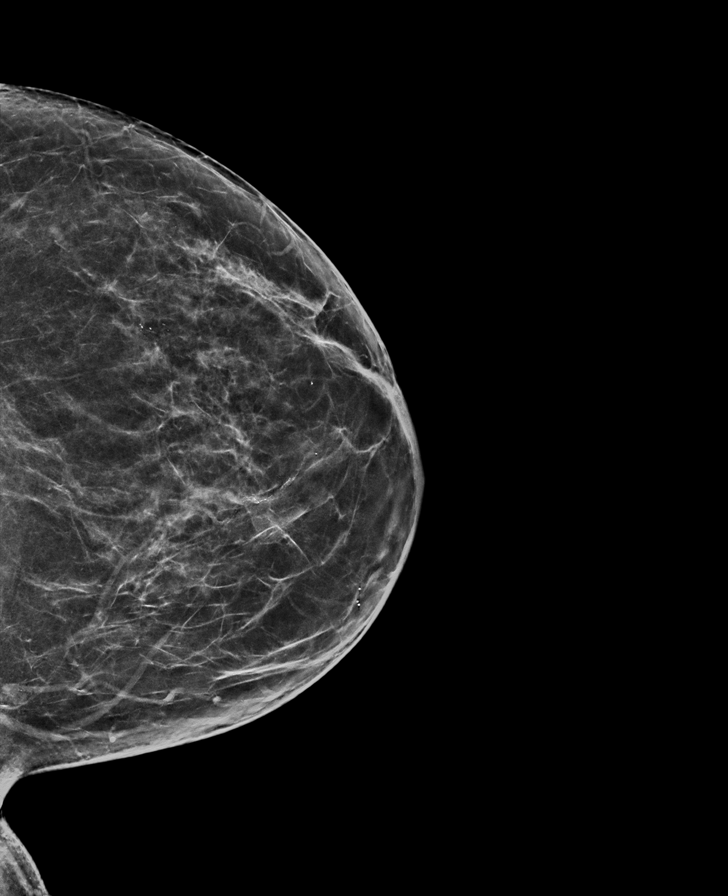

[R CC synth-2D]
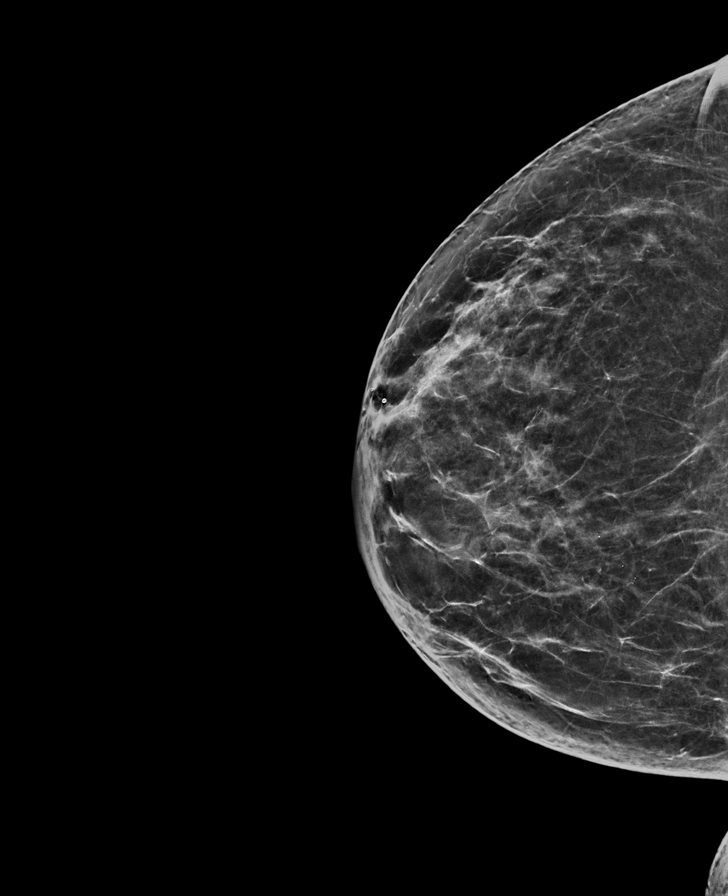

[L MLO synth-2D]
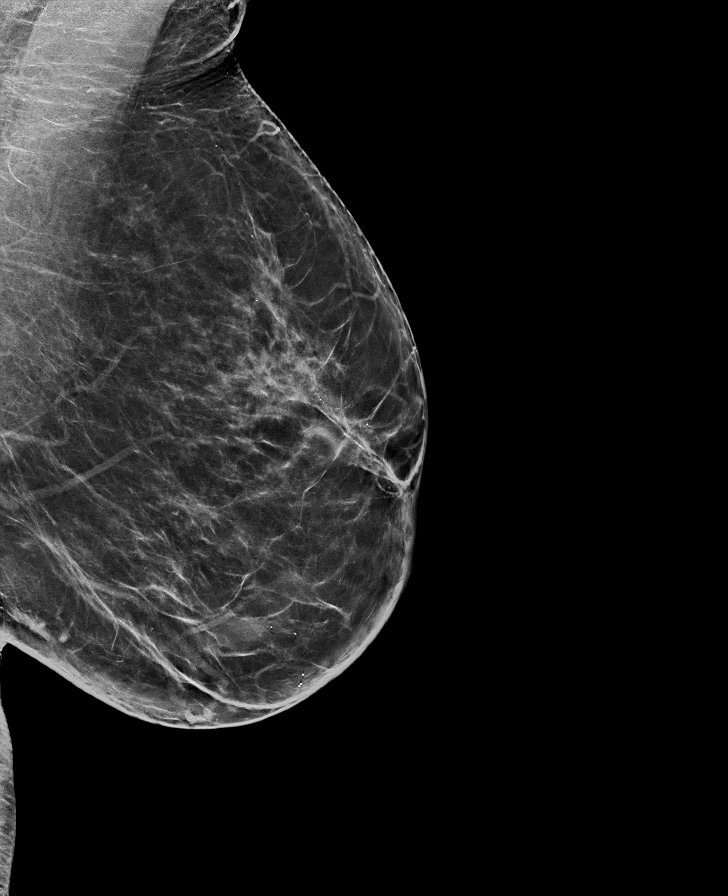

[R MLO synth-2D]
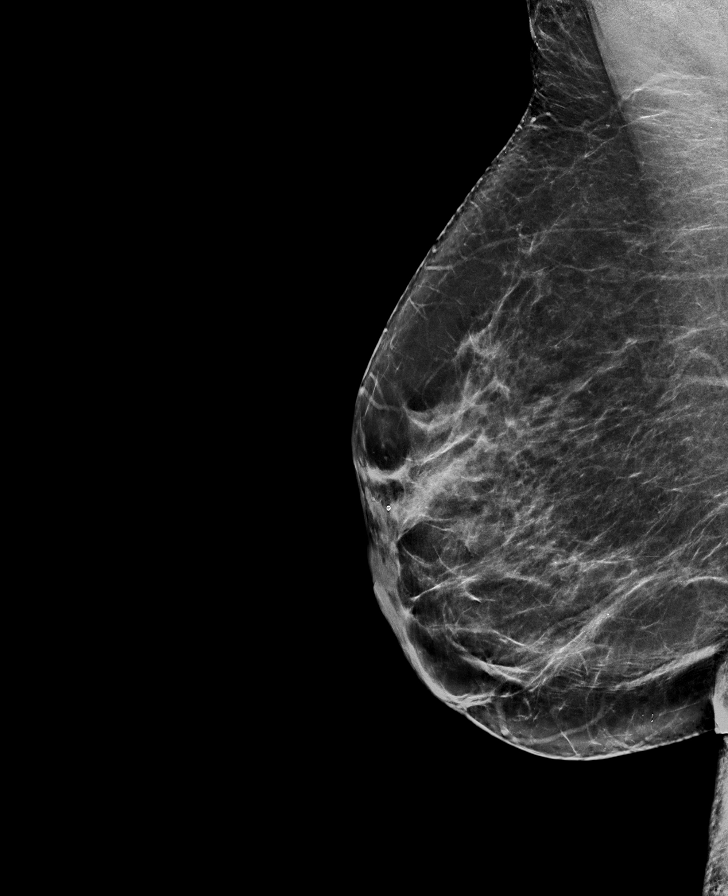

[L MLO tomo · tomo slice 39/76.0]
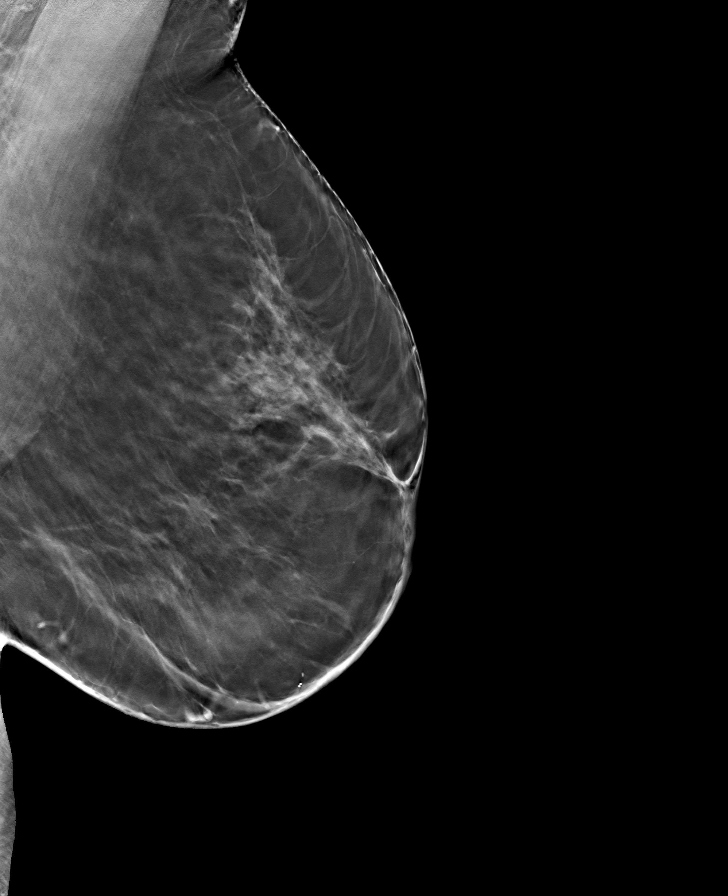

[L CC tomo · tomo slice 36/71.0]
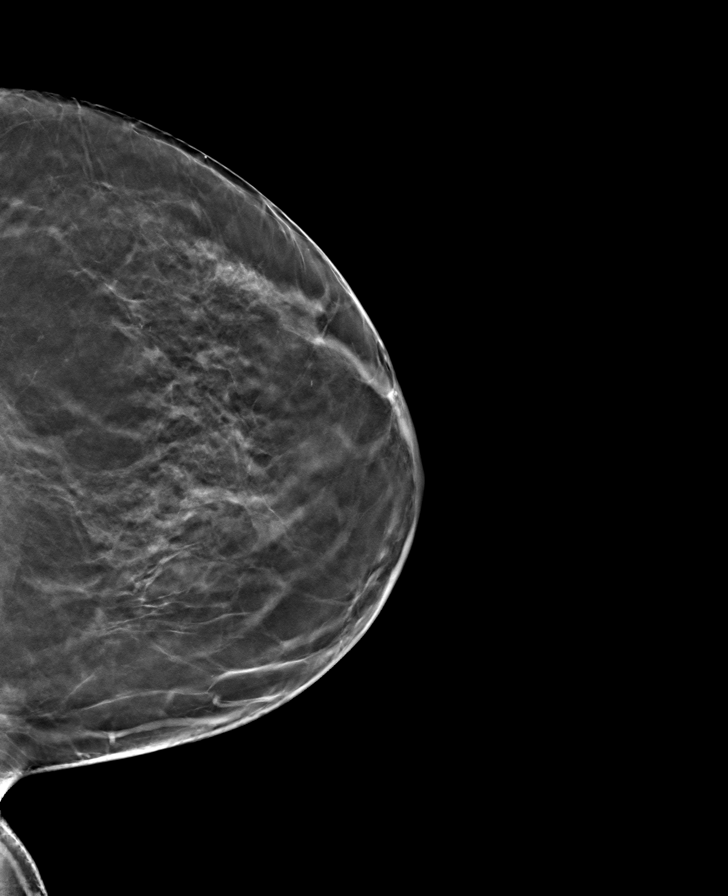

[R CC tomo · tomo slice 35/68.0]
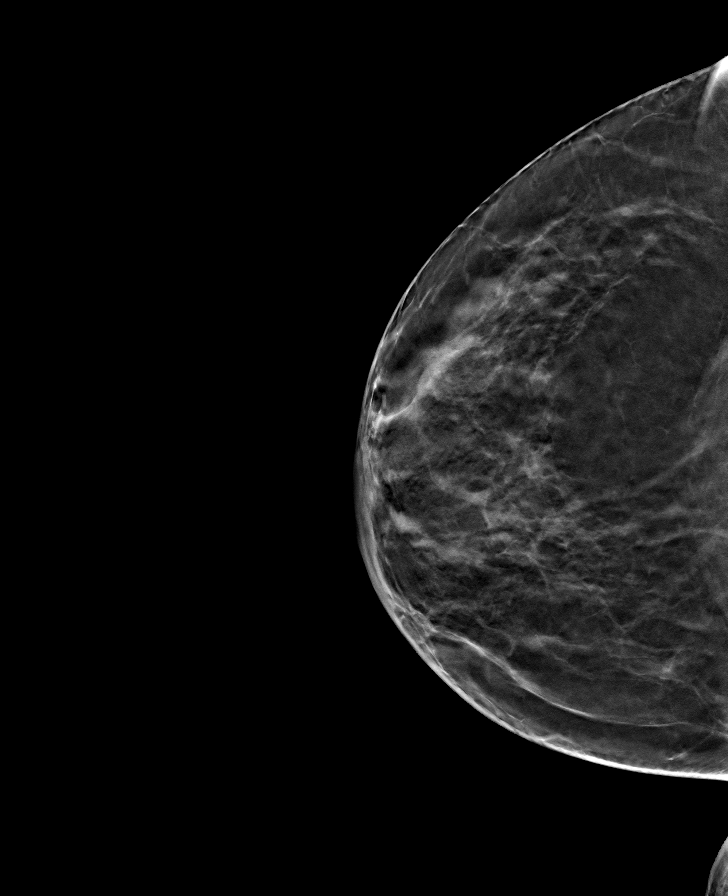

[R MLO tomo · tomo slice 39/77.0]
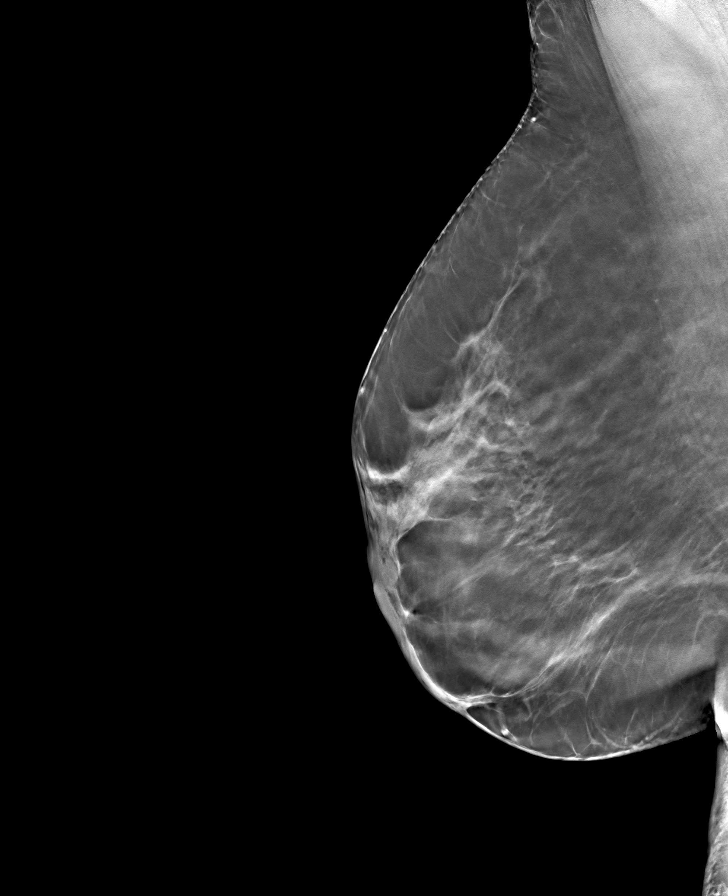

[8 of 24 positions shown; findings below may reference images not displayed]

ACR Breast Density Category b: There are scattered areas of
fibroglandular density.
FINDINGS: There are no findings suspicious for malignancy. The images were
evaluated with computer-aided detection.
IMPRESSION: No mammographic evidence of malignancy. A result letter of this
screening mammogram will be mailed directly to the patient.

RECOMMENDATION:
Screening mammogram in one year. (Code:C7-6-ASJ)

BI-RADS CATEGORY  1: Negative.

## 2023-09-11 DEATH — deceased
